# Patient Record
Sex: Female | Born: 1940 | Race: White | Hispanic: No | Smoking: Never smoker
Health system: Southern US, Community
[De-identification: ages and names within clinical notes are randomized; demographics above are authoritative.]

## PROBLEM LIST (undated history)

## (undated) HISTORY — PX: MELANOMA EXCISION: SHX5266

## (undated) HISTORY — PX: PAROTIDECTOMY: SUR1003

## (undated) HISTORY — PX: PARTIAL HYSTERECTOMY: SHX80

## (undated) HISTORY — PX: APPENDECTOMY: SHX54

## (undated) HISTORY — PX: BLADDER SURGERY: SHX569

---

## 2018-10-12 ENCOUNTER — Encounter (HOSPITAL_COMMUNITY): Payer: Self-pay | Admitting: Emergency Medicine

## 2018-10-12 ENCOUNTER — Emergency Department (HOSPITAL_COMMUNITY)
Admission: EM | Admit: 2018-10-12 | Discharge: 2018-10-12 | Disposition: A | Discharge status: Home / Self Care | Payer: Medicare Other | Attending: Emergency Medicine | Admitting: Emergency Medicine

## 2018-10-12 ENCOUNTER — Emergency Department (HOSPITAL_COMMUNITY): Payer: Medicare Other

## 2018-10-12 ENCOUNTER — Other Ambulatory Visit: Payer: Self-pay

## 2018-10-12 DIAGNOSIS — Y92410 Unspecified street and highway as the place of occurrence of the external cause: Secondary | ICD-10-CM | POA: Insufficient documentation

## 2018-10-12 DIAGNOSIS — R0789 Other chest pain: Secondary | ICD-10-CM | POA: Diagnosis not present

## 2018-10-12 DIAGNOSIS — Y93I9 Activity, other involving external motion: Secondary | ICD-10-CM | POA: Insufficient documentation

## 2018-10-12 DIAGNOSIS — M791 Myalgia, unspecified site: Secondary | ICD-10-CM | POA: Diagnosis not present

## 2018-10-12 DIAGNOSIS — Y999 Unspecified external cause status: Secondary | ICD-10-CM | POA: Insufficient documentation

## 2018-10-12 DIAGNOSIS — M7918 Myalgia, other site: Secondary | ICD-10-CM

## 2018-10-12 DIAGNOSIS — M542 Cervicalgia: Secondary | ICD-10-CM | POA: Diagnosis not present

## 2018-10-12 DIAGNOSIS — R51 Headache: Secondary | ICD-10-CM | POA: Diagnosis present
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
## 2018-10-15 ENCOUNTER — Telehealth (HOSPITAL_COMMUNITY): Payer: Self-pay | Admitting: Emergency Medicine

## 2018-10-15 MED ORDER — TRAMADOL HCL 50 MG PO TABS: 50.0000 mg | ORAL_TABLET | Freq: Four times a day (QID) | ORAL | 0 refills | Status: AC | PRN
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
